# Patient Record
Sex: Female | Born: 1965 | Race: White | Hispanic: No | Marital: Married | Smoking: Current every day smoker
Health system: Southern US, Community
[De-identification: ages and names within clinical notes are randomized; demographics above are authoritative.]

## PROBLEM LIST (undated history)

## (undated) DIAGNOSIS — I10 Essential (primary) hypertension: Secondary | ICD-10-CM

## (undated) DIAGNOSIS — C439 Malignant melanoma of skin, unspecified: Secondary | ICD-10-CM

## (undated) DIAGNOSIS — E785 Hyperlipidemia, unspecified: Secondary | ICD-10-CM

## (undated) HISTORY — PX: MOHS SURGERY: SUR867

---

## 2007-08-07 ENCOUNTER — Encounter: Admission: RE | Admit: 2007-08-07 | Discharge: 2007-08-07 | Payer: Self-pay | Admitting: Internal Medicine

## 2008-10-28 ENCOUNTER — Emergency Department (HOSPITAL_BASED_OUTPATIENT_CLINIC_OR_DEPARTMENT_OTHER): Admission: EM | Admit: 2008-10-28 | Discharge: 2008-10-28 | Payer: Self-pay | Admitting: Emergency Medicine

## 2008-10-28 ENCOUNTER — Ambulatory Visit: Payer: Self-pay | Admitting: Diagnostic Radiology

## 2008-10-30 ENCOUNTER — Ambulatory Visit: Payer: Self-pay | Admitting: Diagnostic Radiology

## 2008-10-30 ENCOUNTER — Emergency Department (HOSPITAL_BASED_OUTPATIENT_CLINIC_OR_DEPARTMENT_OTHER): Admission: EM | Admit: 2008-10-30 | Discharge: 2008-10-30 | Payer: Self-pay | Admitting: Emergency Medicine

## 2010-04-20 LAB — URINE MICROSCOPIC-ADD ON

## 2010-04-20 LAB — URINE CULTURE: Culture: NO GROWTH

## 2010-04-20 LAB — PREGNANCY, URINE: Preg Test, Ur: NEGATIVE

## 2010-04-20 LAB — URINALYSIS, ROUTINE W REFLEX MICROSCOPIC
Bilirubin Urine: NEGATIVE
Bilirubin Urine: NEGATIVE
Glucose, UA: NEGATIVE mg/dL
Glucose, UA: NEGATIVE mg/dL
Ketones, ur: NEGATIVE mg/dL
Ketones, ur: NEGATIVE mg/dL
Leukocytes, UA: NEGATIVE
Nitrite: NEGATIVE
Nitrite: NEGATIVE
Protein, ur: NEGATIVE mg/dL
Protein, ur: NEGATIVE mg/dL
Specific Gravity, Urine: 1.013 (ref 1.005–1.030)
Specific Gravity, Urine: 1.016 (ref 1.005–1.030)
Urobilinogen, UA: 0.2 mg/dL (ref 0.0–1.0)
Urobilinogen, UA: 1 mg/dL (ref 0.0–1.0)
pH: 5.5 (ref 5.0–8.0)
pH: 6.5 (ref 5.0–8.0)

## 2011-07-31 ENCOUNTER — Encounter (HOSPITAL_BASED_OUTPATIENT_CLINIC_OR_DEPARTMENT_OTHER): Payer: Self-pay

## 2011-07-31 ENCOUNTER — Emergency Department (HOSPITAL_BASED_OUTPATIENT_CLINIC_OR_DEPARTMENT_OTHER)
Admission: EM | Admit: 2011-07-31 | Discharge: 2011-07-31 | Disposition: A | Discharge status: Home / Self Care | Payer: Self-pay | Attending: Emergency Medicine | Admitting: Emergency Medicine

## 2011-07-31 DIAGNOSIS — Z882 Allergy status to sulfonamides status: Secondary | ICD-10-CM | POA: Insufficient documentation

## 2011-07-31 DIAGNOSIS — F17200 Nicotine dependence, unspecified, uncomplicated: Secondary | ICD-10-CM | POA: Insufficient documentation

## 2011-07-31 DIAGNOSIS — Z85820 Personal history of malignant melanoma of skin: Secondary | ICD-10-CM | POA: Insufficient documentation

## 2011-07-31 DIAGNOSIS — L02412 Cutaneous abscess of left axilla: Secondary | ICD-10-CM

## 2011-07-31 DIAGNOSIS — IMO0002 Reserved for concepts with insufficient information to code with codable children: Secondary | ICD-10-CM | POA: Insufficient documentation

## 2011-07-31 HISTORY — DX: Malignant melanoma of skin, unspecified: C43.9

## 2011-07-31 MED ORDER — DOXYCYCLINE HYCLATE 100 MG PO CAPS: 100.0000 mg | ORAL_CAPSULE | Freq: Two times a day (BID) | ORAL | Status: AC

## 2011-07-31 MED ORDER — HYDROCODONE-ACETAMINOPHEN 5-500 MG PO TABS: 1.0000 | ORAL_TABLET | Freq: Four times a day (QID) | ORAL | Status: AC | PRN

## 2011-07-31 NOTE — ED Provider Notes (Signed)
History     CSN: [id number]  Arrival date & time 07/31/11  2119   First MD Initiated Contact with Patient 07/31/11 2257      Chief Complaint  Patient presents with  . Abscess    (Consider location/radiation/quality/duration/timing/severity/associated sxs/prior treatment) Patient is a 46 y.o. female presenting with abscess. The history is provided by the patient.  Abscess  This is a new problem. Episode onset: 2 days. The onset was gradual. The problem occurs continuously. The problem has been rapidly worsening. Affected Location: left axilla. The problem is moderate. The abscess is characterized by redness and swelling. It is unknown what she was exposed to. Pertinent negatives include no fever.    Past Medical History  Diagnosis Date  . Melanoma     Past Surgical History  Procedure Date  . Mohs surgery     No family history on file.  History  Substance Use Topics  . Smoking status: Current Everyday Smoker  . Smokeless tobacco: Not on file  . Alcohol Use: No    OB History    Grav Para Term Preterm Abortions TAB SAB Ect Mult Living                  Review of Systems  Constitutional: Negative for fever.  All other systems reviewed and are negative.    Allergies  Sulfa antibiotics  Home Medications   Current Outpatient Rx  Name Route Sig Dispense Refill  . IBUPROFEN 200 MG PO TABS Oral Take 200 mg by mouth every 6 (six) hours as needed. Patient used this medication for swelling.    Marland Kitchen BACITRACIN-NEOMYCIN-POLYMYXIN OINTMENT TUBE Topical Apply 1 application topically as needed. Patient used this medication for soreness.      BP 133/52  Pulse 80  Temp 98.2 F (36.8 C) (Oral)  Resp 18  Ht 5\' 7"  (1.702 m)  Wt 155 lb (70.308 kg)  BMI 24.28 kg/m2  SpO2 99%  Physical Exam  Nursing note and vitals reviewed. Constitutional: She is oriented to person, place, and time. She appears well-developed and well-nourished. No distress.  HENT:  Head: Normocephalic  and atraumatic.  Neck: Normal range of motion. Neck supple.  Musculoskeletal: Normal range of motion.  Neurological: She is alert and oriented to person, place, and time.  Skin: Skin is warm and dry. She is not diaphoretic.       There is 2.5 round, fluctuant lesion in the left axilla.  It is ttp.      ED Course  Procedures (including critical care time)  Labs Reviewed - No data to display No results found.   No diagnosis found.  INCISION AND DRAINAGE Performed by: Geoffery Lyons Consent: Verbal consent obtained. Risks and benefits: risks, benefits and alternatives were discussed Type: abscess  Body area: left axilla  Anesthesia: local infiltration  Local anesthetic: lidocaine 2% without epinephrine  Anesthetic total: 1 ml  Complexity: complex Blunt dissection to break up loculations  Drainage: purulent  Drainage amount: moderate  Packing material: 1/4 in iodoform gauze  Patient tolerance: Patient tolerated the procedure well with no immediate complications.      MDM  Will treat with doxy, lortab, warm soaks.  Packing removal in two days.          Geoffery Lyons, MD 07/31/11 [phone]

## 2011-07-31 NOTE — ED Notes (Signed)
"  pea size knot" to left axilla x 3 months-worse since yesterday

## 2011-07-31 NOTE — ED Notes (Signed)
MD at bedside. 

## 2011-08-02 ENCOUNTER — Encounter (HOSPITAL_BASED_OUTPATIENT_CLINIC_OR_DEPARTMENT_OTHER): Payer: Self-pay | Admitting: *Deleted

## 2011-08-02 ENCOUNTER — Emergency Department (HOSPITAL_BASED_OUTPATIENT_CLINIC_OR_DEPARTMENT_OTHER)
Admission: EM | Admit: 2011-08-02 | Discharge: 2011-08-02 | Disposition: A | Discharge status: Home / Self Care | Payer: Self-pay | Attending: Emergency Medicine | Admitting: Emergency Medicine

## 2011-08-02 DIAGNOSIS — L0291 Cutaneous abscess, unspecified: Secondary | ICD-10-CM

## 2011-08-02 DIAGNOSIS — Z85820 Personal history of malignant melanoma of skin: Secondary | ICD-10-CM | POA: Insufficient documentation

## 2011-08-02 DIAGNOSIS — IMO0002 Reserved for concepts with insufficient information to code with codable children: Secondary | ICD-10-CM | POA: Insufficient documentation

## 2011-08-02 DIAGNOSIS — F17200 Nicotine dependence, unspecified, uncomplicated: Secondary | ICD-10-CM | POA: Insufficient documentation

## 2011-08-02 NOTE — ED Provider Notes (Signed)
History     CSN: [id number]  Arrival date & time 08/02/11  1603   First MD Initiated Contact with Patient 08/02/11 1841      Chief Complaint  Patient presents with  . Abscess    (Consider location/radiation/quality/duration/timing/severity/associated sxs/prior treatment) Patient is a 46 y.o. female presenting with abscess. The history is provided by the patient.  Abscess  This is a new problem. The problem occurs rarely. The abscess is present on the left arm. The problem is moderate. The abscess is characterized by itchiness and redness.  Pt here for recheck of abscess.  Pt thinks packing fell out.  Pt complains of a lump under her arm  Past Medical History  Diagnosis Date  . Melanoma     Past Surgical History  Procedure Date  . Mohs surgery     No family history on file.  History  Substance Use Topics  . Smoking status: Current Everyday Smoker  . Smokeless tobacco: Not on file  . Alcohol Use: No    OB History    Grav Para Term Preterm Abortions TAB SAB Ect Mult Living                  Review of Systems  All other systems reviewed and are negative.    Allergies  Sulfa antibiotics  Home Medications   Current Outpatient Rx  Name Route Sig Dispense Refill  . DOXYCYCLINE HYCLATE 100 MG PO CAPS Oral Take 1 capsule (100 mg total) by mouth 2 (two) times daily. One po bid x 7 days 14 capsule 0  . HYDROCODONE-ACETAMINOPHEN 5-500 MG PO TABS Oral Take 1-2 tablets by mouth every 6 (six) hours as needed for pain. 15 tablet 0  . IBUPROFEN 200 MG PO TABS Oral Take 200 mg by mouth every 6 (six) hours as needed. Patient used this medication for swelling.    Marland Kitchen BACITRACIN-NEOMYCIN-POLYMYXIN OINTMENT TUBE Topical Apply 1 application topically as needed. Patient used this medication for soreness.      BP 112/75  Pulse 69  Temp 98 F (36.7 C) (Oral)  Resp 20  SpO2 100%  Physical Exam  Nursing note and vitals reviewed. Constitutional: She is oriented to person,  place, and time. She appears well-developed and well-nourished.  HENT:  Head: Normocephalic and atraumatic.  Eyes: Conjunctivae and EOM are normal. Pupils are equal, round, and reactive to light.  Neck: Normal range of motion.  Musculoskeletal: Normal range of motion.       Swollen left underam  Neurological: She is alert and oriented to person, place, and time.  Skin: There is erythema.  Psychiatric: She has a normal mood and affect.    ED Course  Procedures (including critical care time)  Labs Reviewed - No data to display No results found. I removed cystic material from wound,  No packing  1. Abscess       MDM          Elson Areas, [place] 08/02/11 1902

## 2011-08-02 NOTE — ED Notes (Signed)
Here for recheck of an abscess under her left arm that was I&D'd 2 days ago.

## 2011-08-02 NOTE — ED Provider Notes (Signed)
History/physical exam/procedure(s) were performed by non-physician practitioner and as supervising physician I was immediately available for consultation/collaboration. I have reviewed all notes and am in agreement with care and plan.   Johann Gascoigne S Lian Pounds, MD 08/02/11 2337 

## 2012-01-27 ENCOUNTER — Encounter (HOSPITAL_BASED_OUTPATIENT_CLINIC_OR_DEPARTMENT_OTHER): Payer: Self-pay | Admitting: *Deleted

## 2012-01-27 ENCOUNTER — Emergency Department (HOSPITAL_BASED_OUTPATIENT_CLINIC_OR_DEPARTMENT_OTHER)
Admission: EM | Admit: 2012-01-27 | Discharge: 2012-01-27 | Disposition: A | Discharge status: Home / Self Care | Payer: Self-pay | Attending: Emergency Medicine | Admitting: Emergency Medicine

## 2012-01-27 DIAGNOSIS — Z85820 Personal history of malignant melanoma of skin: Secondary | ICD-10-CM | POA: Insufficient documentation

## 2012-01-27 DIAGNOSIS — M5431 Sciatica, right side: Secondary | ICD-10-CM

## 2012-01-27 DIAGNOSIS — M545 Low back pain, unspecified: Secondary | ICD-10-CM | POA: Insufficient documentation

## 2012-01-27 DIAGNOSIS — F17200 Nicotine dependence, unspecified, uncomplicated: Secondary | ICD-10-CM | POA: Insufficient documentation

## 2012-01-27 DIAGNOSIS — M5430 Sciatica, unspecified side: Secondary | ICD-10-CM | POA: Insufficient documentation

## 2012-01-27 MED ORDER — HYDROCODONE-ACETAMINOPHEN 5-325 MG PO TABS: 2.0000 | ORAL_TABLET | ORAL | Status: AC | PRN

## 2012-01-27 MED ORDER — PREDNISONE 10 MG PO TABS: ORAL_TABLET | ORAL | Status: DC

## 2012-01-27 NOTE — ED Provider Notes (Signed)
Medical screening examination/treatment/procedure(s) were performed by non-physician practitioner and as supervising physician I was immediately available for consultation/collaboration.   Gavin Pound. Oletta Lamas, MD 01/27/12 2023

## 2012-01-27 NOTE — ED Provider Notes (Signed)
History     CSN: [id number]  Arrival date & time 01/27/12  4098   First MD Initiated Contact with Patient 01/27/12 1952      Chief Complaint  Patient presents with  . Hip Pain    (Consider location/radiation/quality/duration/timing/severity/associated sxs/prior treatment) Patient is a 47 y.o. female presenting with back pain. The history is provided by the patient. No language interpreter was used.  Back Pain  This is a new problem. The current episode started more than 2 days ago. The problem occurs constantly. The problem has been gradually worsening. The pain is associated with no known injury. The pain is present in the lumbar spine. The quality of the pain is described as aching. The pain does not radiate. The pain is at a severity of 7/10. The patient is experiencing no pain. The pain is the same all the time. Stiffness is present all day. She has tried nothing for the symptoms. The treatment provided no relief.  Pt complains of pain going down her right leg.  Pt reports she had the same about a year ago  Past Medical History  Diagnosis Date  . Melanoma     Past Surgical History  Procedure Date  . Mohs surgery     History reviewed. No pertinent family history.  History  Substance Use Topics  . Smoking status: Current Every Day Smoker  . Smokeless tobacco: Not on file  . Alcohol Use: No    OB History    Grav Para Term Preterm Abortions TAB SAB Ect Mult Living                  Review of Systems  Musculoskeletal: Positive for back pain.  All other systems reviewed and are negative.    Allergies  Sulfa antibiotics  Home Medications   Current Outpatient Rx  Name  Route  Sig  Dispense  Refill  . IBUPROFEN 200 MG PO TABS   Oral   Take 200 mg by mouth every 6 (six) hours as needed. Patient used this medication for swelling.         Marland Kitchen BACITRACIN-NEOMYCIN-POLYMYXIN OINTMENT TUBE   Topical   Apply 1 application topically as needed. Patient used this  medication for soreness.           BP 115/65  Pulse 84  Temp 98.3 F (36.8 C) (Oral)  Resp 18  Wt 159 lb (72.122 kg)  SpO2 100%  Physical Exam  Nursing note and vitals reviewed. Constitutional: She appears well-developed and well-nourished.  HENT:  Head: Normocephalic and atraumatic.  Eyes: Pupils are equal, round, and reactive to light.  Neck: Normal range of motion.  Cardiovascular: Normal rate and regular rhythm.   Pulmonary/Chest: Effort normal and breath sounds normal.  Abdominal: Soft.  Musculoskeletal: She exhibits tenderness.       Tender ls spine right side,  nv and ns intact  Neurological: She is alert.  Skin: Skin is warm.    ED Course  Procedures (including critical care time)  Labs Reviewed - No data to display No results found.   No diagnosis found.    MDM     Prednisone taper,  rx for hydrocodone.   Pt advised to follow up with Dr. Pearletha Forge for recheck if pain persist past one week     Lonia Skinner [place], [place] 01/27/12 2020

## 2012-01-27 NOTE — ED Notes (Signed)
Right hip pain radiates down her right leg.

## 2012-01-27 NOTE — ED Notes (Signed)
MD at bedside. Langston Masker, PA to bedside.

## 2012-01-29 ENCOUNTER — Ambulatory Visit: Payer: Self-pay | Admitting: Family Medicine

## 2012-01-30 ENCOUNTER — Ambulatory Visit: Payer: Self-pay | Admitting: Family Medicine

## 2012-01-31 ENCOUNTER — Ambulatory Visit: Payer: Self-pay | Admitting: Family Medicine

## 2012-06-18 ENCOUNTER — Ambulatory Visit: Payer: Self-pay | Attending: Family Medicine | Admitting: Internal Medicine

## 2012-06-18 VITALS — BP 158/79 | HR 55 | Temp 98.3°F | Resp 16 | Wt 165.6 lb

## 2012-06-18 DIAGNOSIS — Z09 Encounter for follow-up examination after completed treatment for conditions other than malignant neoplasm: Secondary | ICD-10-CM | POA: Insufficient documentation

## 2012-06-18 DIAGNOSIS — C439 Malignant melanoma of skin, unspecified: Secondary | ICD-10-CM

## 2012-06-18 DIAGNOSIS — Z85820 Personal history of malignant melanoma of skin: Secondary | ICD-10-CM | POA: Insufficient documentation

## 2012-06-18 NOTE — Progress Notes (Signed)
Patient ID: Veronica Griffith, female   DOB: 1965-03-09, 47 y.o.   MRN: [id number]  CC: dermatology referral   HPI: Pt is 47 yo female with history of melanoma, came in requesting referral to ensure no additional biopsy needed. She has noticed small brown bump in right forearm, 1 cm in diameter, brown, irregular border. She denies fevers, chill, abdominal or urinary concerns.   Allergies  Allergen Reactions  . Sulfa Antibiotics Other (See Comments)    Burning sensation from the inside out.   Past Medical History  Diagnosis Date  . Melanoma    Current Outpatient Prescriptions on File Prior to Visit  Medication Sig Dispense Refill  . ibuprofen (ADVIL,MOTRIN) 200 MG tablet Take 200 mg by mouth every 6 (six) hours as needed. Patient used this medication for swelling.      . neomycin-bacitracin-polymyxin (NEOSPORIN) OINT Apply 1 application topically as needed. Patient used this medication for soreness.      . predniSONE (DELTASONE) 10 MG tablet 6,5,4,3,2,1 taper  21 tablet  0   No current facility-administered medications on file prior to visit.   Family history of cancers.  History   Social History  . Marital Status: Married    Spouse Name: N/A    Number of Children: N/A  . Years of Education: N/A   Occupational History  . Not on file.   Social History Main Topics  . Smoking status: Current Every Day Smoker  . Smokeless tobacco: Not on file  . Alcohol Use: No  . Drug Use: No  . Sexually Active: Not on file   Other Topics Concern  . Not on file   Social History Narrative  . No narrative on file    Review of Systems  Constitutional: Negative for fever, chills, diaphoresis, activity change, appetite change and fatigue.  HENT: Negative for ear pain, nosebleeds, congestion, facial swelling, rhinorrhea, neck pain, neck stiffness and ear discharge.   Eyes: Negative for pain, discharge, redness, itching and visual disturbance.  Respiratory: Negative for cough, choking,  chest tightness, shortness of breath, wheezing and stridor.   Cardiovascular: Negative for chest pain, palpitations and leg swelling.  Gastrointestinal: Negative for abdominal distention.  Genitourinary: Negative for dysuria, urgency, frequency, hematuria, flank pain, decreased urine volume, difficulty urinating and dyspareunia.  Musculoskeletal: Negative for back pain, joint swelling, arthralgias and gait problem.  Neurological: Negative for dizziness, tremors, seizures, syncope, facial asymmetry, speech difficulty, weakness, light-headedness, numbness and headaches.  Hematological: Negative for adenopathy. Does not bruise/bleed easily.  Psychiatric/Behavioral: Negative for hallucinations, behavioral problems, confusion, dysphoric mood, decreased concentration and agitation.    Objective:   Filed Vitals:   06/18/12 1157  BP: 158/79  Pulse: 55  Temp: 98.3 F (36.8 C)  Resp: 16    Physical Exam  Constitutional: Appears well-developed and well-nourished. No distress.  HENT: Normocephalic. External right and left ear normal. Oropharynx is clear and moist.  Eyes: Conjunctivae and EOM are normal. PERRLA, no scleral icterus.  Neck: Normal ROM. Neck supple. No JVD. No tracheal deviation. No thyromegaly.  CVS: RRR, S1/S2 +, no murmurs, no gallops, no carotid bruit.  Pulmonary: Effort and breath sounds normal, no stridor, rhonchi, wheezes, rales.  Abdominal: Soft. BS +,  no distension, tenderness, rebound or guarding.  Musculoskeletal: Normal range of motion. No edema and no tenderness.  Lymphadenopathy: No lymphadenopathy noted, cervical, inguinal. Neuro: Alert. Normal reflexes, muscle tone coordination. No cranial nerve deficit. Skin: Skin is warm and dry. No rash noted. Not diaphoretic. No erythema. No  pallor. Small brown 1 cm spot, irregular border on the right forearm.   Psychiatric: Normal mood and affect. Behavior, judgment, thought content normal.   No results found for this  basename: WBC, HGB, HCT, MCV, PLT   No results found for this basename: CREATININE, BUN, NA, K, CL, CO2    No results found for this basename: HGBA1C   Lipid Panel  No results found for this basename: chol, trig, hdl, cholhdl, vldl, ldlcalc       Assessment and plan:   Skin spot - worrisome for malignancy, will order referral for dermatology - discusses protective clothing and sunscreen use

## 2012-06-18 NOTE — Patient Instructions (Addendum)
Melanoma Melanoma is the least common, but most dangerous, form of skin cancer. This is because it can spread (metastasize) to other organs and can be life-threatening. Melanoma is a cancerous (malignant) tumor that begins in a certain type of cells, called melanocytes. Melanocytes are the cells that produce the color (pigment) called melanin. Melanin colors our skin, hair, eyes, and moles. CAUSES  The exact cause of melanoma is unknown. You may have a higher risk if you:  Spend or have spent a lot of time in the sun. This includes sunlamp and tanning booth exposure.  Have had sunburns. This put you at a particularly increased risk for melanoma. The more blistering sunburns a person has, the higher the risk.  Spend time in parts of the world with more intense sunlight.  Have fair skin that does not tan easily. You may have a lower risk if you have a darker skin color. However, people with darker skin can get melanoma, especially on the hands and feet (acral areas).  Have a close relative (parent, sibling) who has melanoma.  Have a large number of skin moles (more than 100). SYMPTOMS  A skin mole is suspicious if it has any of these 5 traits. This is called the ABCDE's of melanoma:  Asymmetry: Irregular shape, not simply round or oval.  Border: Edge of the mole is irregular, not smooth.  Color: Mole may have multiple colors in it, including brown, black, blue, red, or tan.  Diameter: More than 0.2 inches (6 mm) across.  Evolving: Any unusual change or symptoms in the mole, such as pain, itching, stinging, sensitivity, or bleeding. A mole that is noticeably changing in appearance, or any new mole, should be checked for melanoma. In general, people develop new moles until age 30. New moles after this age should be brought to the attention of your caregiver. DIAGNOSIS  Your caregiver can look at your skin and find lesions or moles that may be suspicious. A patient may also notice a mole  with symptoms or a mole that does not look like most of the other moles on his or her body. This is called the "ugly duckling" sign. A tissue sample (biopsy) examined under a microscope is needed to determine if it is melanoma. The size and extent of the biopsy will depend on the location, size, and appearance of the skin lesion or mole. The biopsy can also reveal whether melanoma has spread to deeper layers of the skin. TREATMENT  Surgery to completely remove the melanoma is required. Lymph nodes may also be removed. If the melanoma has spread to other organs, such as the liver, lungs, bone, or brain, cancer-fighting drugs (chemotherapy) must be used. Your caregiver will discuss your treatment options with you. You can ask about being included in a clinical trial to evaluate new forms of treatment. Melanoma can occasionally recur years after the initial diagnosis. If you have melanoma, you will need follow-up visits with your caregiver for many years. PREVENTION  Risk for melanoma can be reduced by minimizing sun exposure. Practice the 3 S's:  Slip on a shirt.  Slop on sunscreen.  Slap on a hat. Do not spend time in the sun during peak midafternoon hours. Sunscreen/sunblock with SPF 30 or higher and UVA/UVB block should be applied regularly. You should do this even during brief exposure to sunlight. You should also do this on cloudy days and in winter, even though the perceived sunlight is less. Always avoid sunburn! Wear sunglasses that block UV   light. Be sure to see your caregiver if you have any new or changing moles. HOME CARE INSTRUCTIONS   Follow wound care instructions after surgical removal of your melanoma.  Practice good sun avoidance and protective measures as described above.  Let your close family members (parents, children, siblings) know about your diagnosis. This puts them at a higher risk of getting melanoma than the general population. SEEK MEDICAL CARE IF:   You notice any  new moles, or you have any moles that are changing.  You have had a melanoma removed and you notice a new growth near the same location.  You have had a melanoma removed and you experience any new or unexplained health problems. Document Released: 01/01/2005 Document Revised: 03/26/2011 Document Reviewed: 04/22/2009 [employer] Patient Information 2014 [employer].  

## 2012-06-18 NOTE — Progress Notes (Signed)
Patient is here to establish care Has history of melanoma to right side of face Has some concerns about other moles on her body Will need referral to dermatology

## 2016-10-29 ENCOUNTER — Emergency Department (HOSPITAL_COMMUNITY): Payer: BLUE CROSS/BLUE SHIELD

## 2016-10-29 ENCOUNTER — Encounter (HOSPITAL_COMMUNITY): Payer: Self-pay | Admitting: Emergency Medicine

## 2016-10-29 DIAGNOSIS — Z85820 Personal history of malignant melanoma of skin: Secondary | ICD-10-CM | POA: Insufficient documentation

## 2016-10-29 DIAGNOSIS — R51 Headache: Secondary | ICD-10-CM | POA: Insufficient documentation

## 2016-10-29 DIAGNOSIS — J3489 Other specified disorders of nose and nasal sinuses: Secondary | ICD-10-CM | POA: Insufficient documentation

## 2016-10-29 DIAGNOSIS — F17210 Nicotine dependence, cigarettes, uncomplicated: Secondary | ICD-10-CM | POA: Diagnosis not present

## 2016-10-29 LAB — CBC
HEMATOCRIT: 48.1 % — AB (ref 36.0–46.0)
HEMOGLOBIN: 15.9 g/dL — AB (ref 12.0–15.0)
MCH: 30.1 pg (ref 26.0–34.0)
MCHC: 33.1 g/dL (ref 30.0–36.0)
MCV: 91.1 fL (ref 78.0–100.0)
PLATELETS: 237 10*3/uL (ref 150–400)
RBC: 5.28 MIL/uL — AB (ref 3.87–5.11)
RDW: 13.4 % (ref 11.5–15.5)
WBC: 14.4 10*3/uL — AB (ref 4.0–10.5)

## 2016-10-29 LAB — BASIC METABOLIC PANEL
ANION GAP: 10 (ref 5–15)
BUN: 17 mg/dL (ref 6–20)
CO2: 23 mmol/L (ref 22–32)
Calcium: 8.9 mg/dL (ref 8.9–10.3)
Chloride: 104 mmol/L (ref 101–111)
Creatinine, Ser: 0.95 mg/dL (ref 0.44–1.00)
GFR calc non Af Amer: >60 mL/min (ref >60)
Glucose, Bld: 90 mg/dL (ref 65–99)
Potassium: 4.2 mmol/L (ref 3.5–5.1)
SODIUM: 137 mmol/L (ref 135–145)

## 2016-10-29 LAB — I-STAT TROPONIN, ED: Troponin i, poc: 0 ng/mL (ref 0.00–0.08)

## 2016-10-29 NOTE — ED Triage Notes (Signed)
Pt presents to ED for assessment of left sided headache with sharp intermittent pains, denies any pattern, denies dizziness, light-headedness, denies nausea or vomiting.  Pt also mentioned close to the end of her triage that she was having chest pressure for "a little while now" as well.  Epigastric in nature.

## 2016-10-30 ENCOUNTER — Emergency Department (HOSPITAL_COMMUNITY): Payer: BLUE CROSS/BLUE SHIELD

## 2016-10-30 ENCOUNTER — Emergency Department (HOSPITAL_COMMUNITY)
Admission: EM | Admit: 2016-10-30 | Discharge: 2016-10-30 | Disposition: A | Discharge status: Home / Self Care | Payer: BLUE CROSS/BLUE SHIELD | Attending: Emergency Medicine | Admitting: Emergency Medicine

## 2016-10-30 DIAGNOSIS — R51 Headache: Secondary | ICD-10-CM

## 2016-10-30 DIAGNOSIS — R519 Headache, unspecified: Secondary | ICD-10-CM

## 2016-10-30 DIAGNOSIS — R0981 Nasal congestion: Secondary | ICD-10-CM

## 2016-10-30 MED ORDER — FLUTICASONE PROPIONATE 50 MCG/ACT NA SUSP: 2.0000 | Freq: Every day | NASAL | 0 refills | Status: AC

## 2016-10-30 NOTE — Discharge Instructions (Signed)
You were seen today for headache. Your workup is reassuring. Given your congestion symptoms, this may be related to your sinuses. You can take nasal saline or Flonase to try to clear the sinuses. If you develop fevers, neck stiffness, any new or worsening symptoms she should be reevaluated.

## 2016-10-30 NOTE — ED Provider Notes (Signed)
[facility] Provider Note   CSN: [id number] Arrival date & time: 10/29/16  2050     History   Chief Complaint Chief Complaint  Patient presents with  . Chest Pain  . Headache    HPI Veronica Griffith is a 51 y.o. female.  HPI  This is a 51 year old female with a history of melanoma who presents with intermittent left-sided headache. Patient reports 2 day history of sharp left-sided pains in her head. She states they last for seconds and go away. They do not seem to be associated with anything. However today, they persisted longer. Reports recent history of chest congestion and sinus pressure. No fevers. No cough, shortness of breath, abdominal pain, nausea, vomiting. Denies any weakness, numbness, tingling or neurologic symptoms. Currently patient is without symptoms. She denies any neck pain or vision changes.  Past Medical History:  Diagnosis Date  . Melanoma ([facility])     There are no active problems to display for this patient.   Past Surgical History:  Procedure Laterality Date  . MOHS SURGERY      OB History    No data available       Home Medications    Prior to Admission medications   Medication Sig Start Date End Date Taking? Authorizing Provider  ibuprofen (ADVIL,MOTRIN) 200 MG tablet Take 200 mg by mouth every 6 (six) hours as needed for moderate pain.    Yes [provider]  fluticasone (FLONASE) 50 MCG/ACT nasal spray Place 2 sprays into both nostrils daily. 10/30/16   Horton, Barbette Hair, MD    Family History History reviewed. No pertinent family history.  Social History Social History  Substance Use Topics  . Smoking status: Current Every Day Smoker  . Smokeless tobacco: Never Used  . Alcohol use No     Allergies   Sulfa antibiotics   Review of Systems Review of Systems  Constitutional: Negative for fever.  HENT: Positive for congestion and sinus pressure.   Respiratory: Negative for  cough, chest tightness and shortness of breath.   Cardiovascular: Negative for chest pain.  Gastrointestinal: Negative for abdominal pain, nausea and vomiting.  Musculoskeletal: Negative for neck pain.  Skin: Negative for rash.  Neurological: Positive for headaches. Negative for dizziness.  Psychiatric/Behavioral: Negative for confusion.  All other systems reviewed and are negative.    Physical Exam Updated Vital Signs BP 124/74   Pulse (!) 50   Temp 98.7 F (37.1 C) (Oral)   Resp 20   SpO2 100%   Physical Exam  Constitutional: She is oriented to person, place, and time. She appears well-developed and well-nourished.  HENT:  Head: Normocephalic and atraumatic.  No tenderness to palpation of the scalp, no obvious rashes  Eyes: Pupils are equal, round, and reactive to light.  No nystagmus  Neck: Normal range of motion. Neck supple.  No evidence of meningismus  Cardiovascular: Normal rate, regular rhythm and normal heart sounds.   Pulmonary/Chest: Effort normal and breath sounds normal. No respiratory distress. She has no wheezes.  Abdominal: Soft. There is no tenderness.  Neurological: She is alert and oriented to person, place, and time.  Cranial nerves II through XII intact, 5 out of 5 strength in all 4 extremities, no dysmetria to finger-nose-finger  Skin: Skin is warm and dry.  Psychiatric: She has a normal mood and affect.  Nursing note and vitals reviewed.    ED Treatments / Results  Labs (all labs ordered are listed, but only  abnormal results are displayed) Labs Reviewed  CBC - Abnormal; Notable for the following:       Result Value   WBC 14.4 (*)    RBC 5.28 (*)    Hemoglobin 15.9 (*)    HCT 48.1 (*)    All other components within normal limits  BASIC METABOLIC PANEL  I-STAT TROPONIN, ED    EKG  EKG Interpretation  Date/Time:  Monday October 29 2016 21:16:51 EDT Ventricular Rate:  68 PR Interval:  150 QRS Duration: 82 QT Interval:  432 QTC  Calculation: 459 R Axis:   48 Text Interpretation:  Normal sinus rhythm Normal ECG Confirmed by Thayer Jew [phone]) on 10/30/2016 2:35:00 AM       Radiology Dg Chest 2 View  Result Date: 10/29/2016 CLINICAL DATA:  Chest pressure and epigastric pain. EXAM: CHEST  2 VIEW COMPARISON:  None. FINDINGS: The heart size and mediastinal contours are within normal limits. Both lungs are clear. Mild multilevel disc space narrowing along the lower thoracic spine. No acute osseous appearing abnormality. IMPRESSION: No active cardiopulmonary disease. Electronically Signed   By: Ashley Royalty M.D.   On: 10/29/2016 22:03   Ct Head Wo Contrast  Result Date: 10/30/2016 CLINICAL DATA:  Headache EXAM: CT HEAD WITHOUT CONTRAST TECHNIQUE: Contiguous axial images were obtained from the base of the skull through the vertex without intravenous contrast. COMPARISON:  None. FINDINGS: Brain: No evidence of acute infarction, hemorrhage, hydrocephalus, extra-axial collection or mass lesion/mass effect. Mild superficial atrophy for age. Vascular: No hyperdense vessel or unexpected calcification. Skull: Normal. Negative for fracture or focal lesion. Sinuses/Orbits: Polypoid mucosal thickening of the left maxillary sinus. Mild dextroconvex curvature of the nasal septum with right-sided nasal septal spur slightly narrowing the right nasal passage. Other: Clear mastoids bilaterally. IMPRESSION: 1. No acute intracranial abnormality.  Mild superficial atrophy. 2. Mild dextroconvex curvature of the nasal septum with right-sided nasal septal spur. This slightly narrows the right nasal passage. 3. Polypoid mucosal thickening of the left maxillary sinus. Electronically Signed   By: Ashley Royalty M.D.   On: 10/30/2016 03:03    Procedures Procedures (including critical care time)  Medications Ordered in ED Medications - No data to display   Initial Impression / Assessment and Plan / ED Course  I have reviewed the triage vital  signs and the nursing notes.  Pertinent labs & imaging results that were available during my care of the patient were reviewed by me and considered in my medical decision making (see chart for details).     Patient presents with intermittent headache. Fairly atypical in nature. She's currently comfortable. Vital signs reassuring and exam is benign. No evidence of scalp rash to indicate shingles. She is afebrile and no signs of meningitis. Doubt subarachnoid hemorrhage. She does have a history of melanoma. While she has a normal neurologic exam, will obtain a CT scan to rule out atypical presentation of metastasis. CT scan is negative. Other screening lab work, EKG, troponin sent from triage and largely reassuring. This was an evaluation for the patient's chest pressure. However, she endorses recent congestion and upper respiratory symptoms which is likely the etiology.  After history, exam, and medical workup I feel the patient has been appropriately medically screened and is safe for discharge home. Pertinent diagnoses were discussed with the patient. Patient was given return precautions.   Final Clinical Impressions(s) / ED Diagnoses   Final diagnoses:  Nonintractable episodic headache, unspecified headache type  Sinus congestion    New Prescriptions  New Prescriptions   FLUTICASONE (FLONASE) 50 MCG/ACT NASAL SPRAY    Place 2 sprays into both nostrils daily.     Merryl Hacker, MD 10/30/16 [phone]

## 2018-07-01 IMAGING — CT CT HEAD W/O CM
4 series · 17 of 47 positions shown, 19 images · non-contrast
Comparison: None.

CLINICAL DATA: Headache

EXAM:
CT HEAD WITHOUT CONTRAST
TECHNIQUE: Contiguous axial images were obtained from the base of the skull
through the vertex without intravenous contrast.

[Series 3: head without · axial · non-contrast · 0.41mm/px · z∈[-178,-58]mm · 7 of 34 slices shown, 9 images]
[im 5/34  brain]
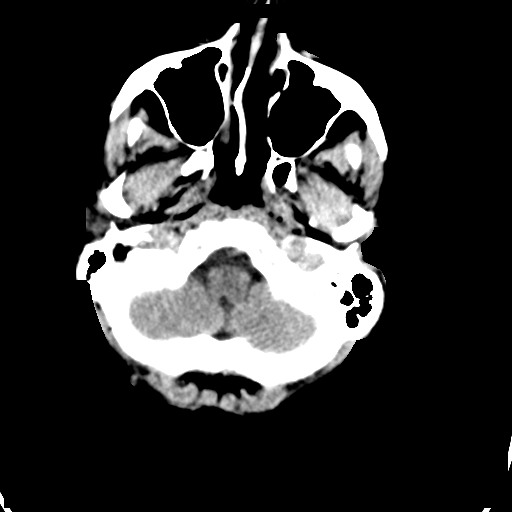
[im 5/34  bone]
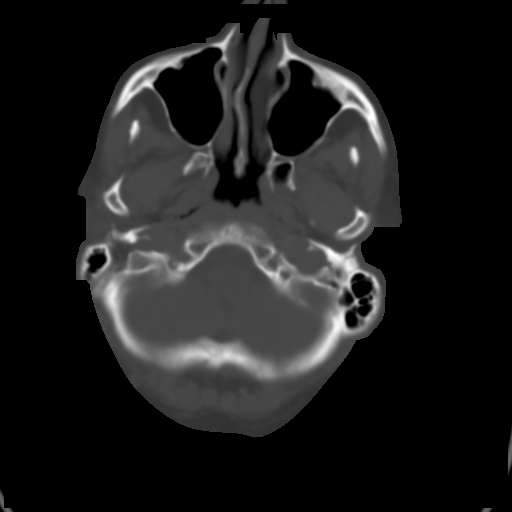
[im 9/34  brain]
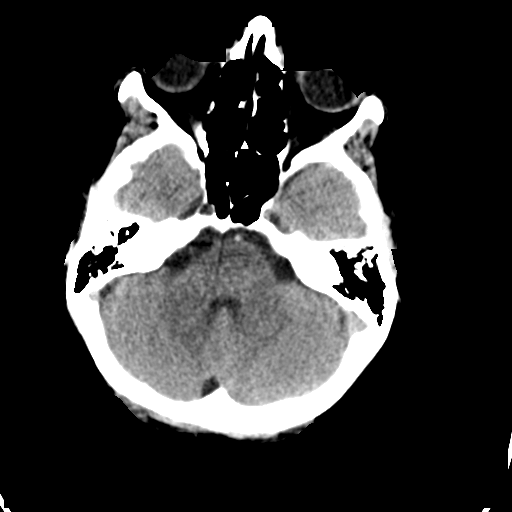
[im 13/34  brain]
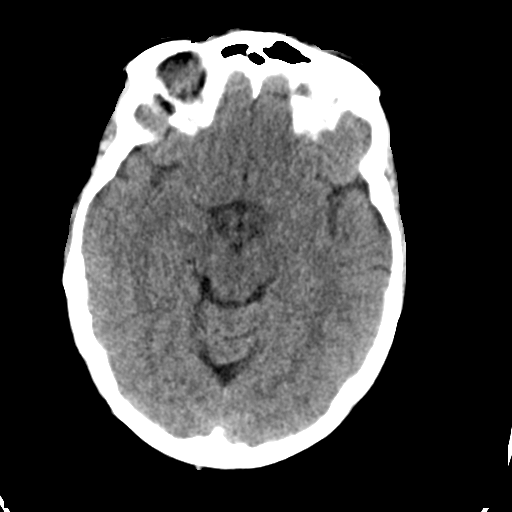
[im 17/34  brain]
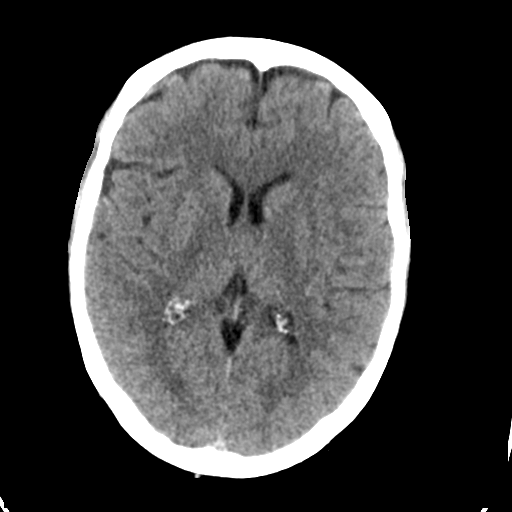
[im 21/34  brain]
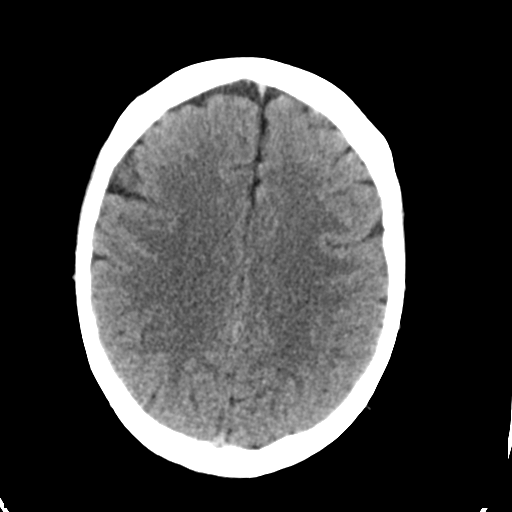
[im 21/34  bone]
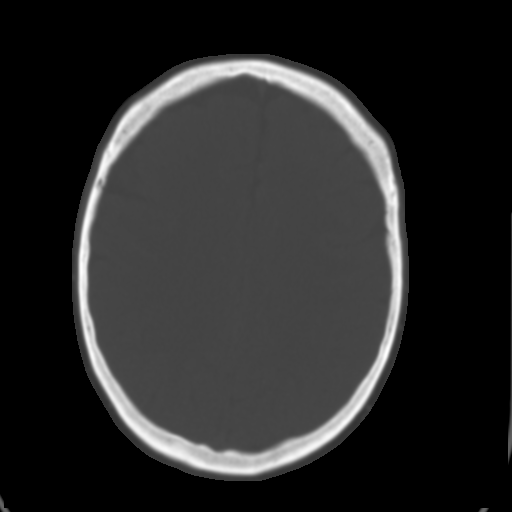
[im 25/34  brain]
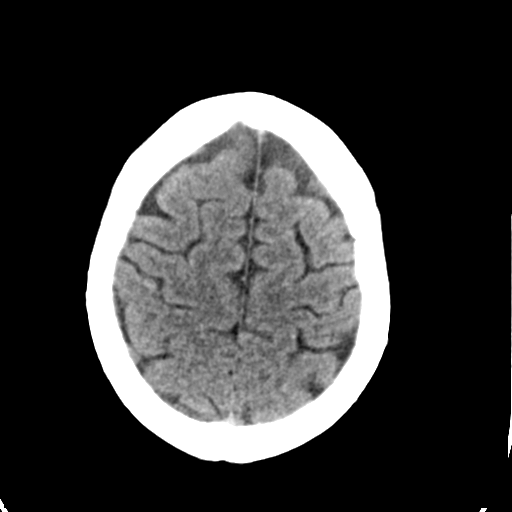
[im 29/34  brain]
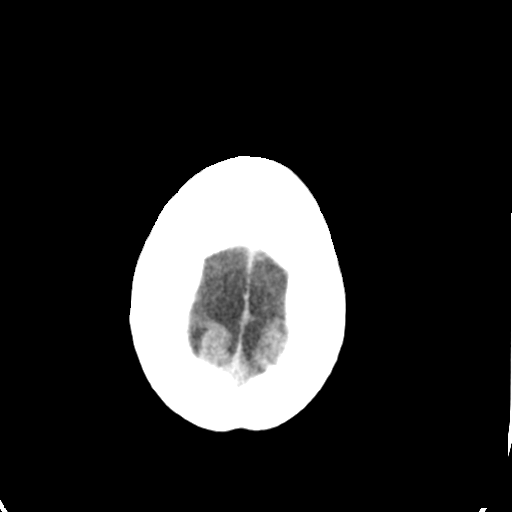

[Series 4: head bone · axial · 0.41mm/px · z∈[-182,-124]mm · 4 of 85 slices shown]
[im 9/85  bone]
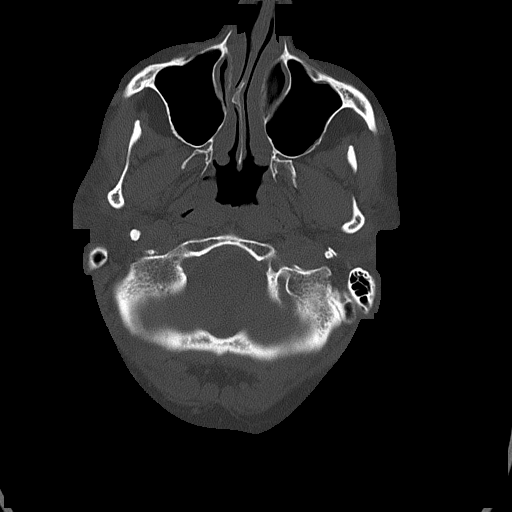
[im 17/85  bone]
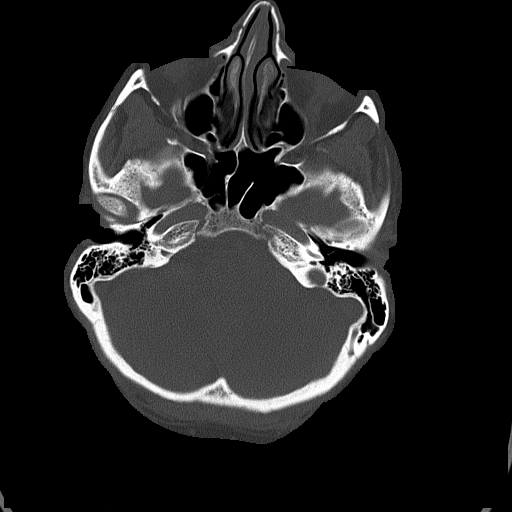
[im 26/85  bone]
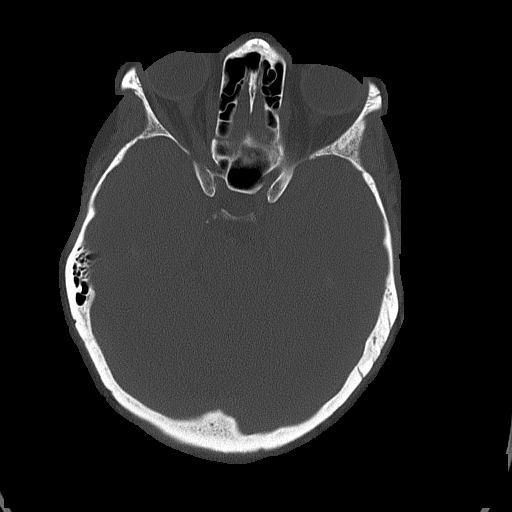
[im 38/85  bone]
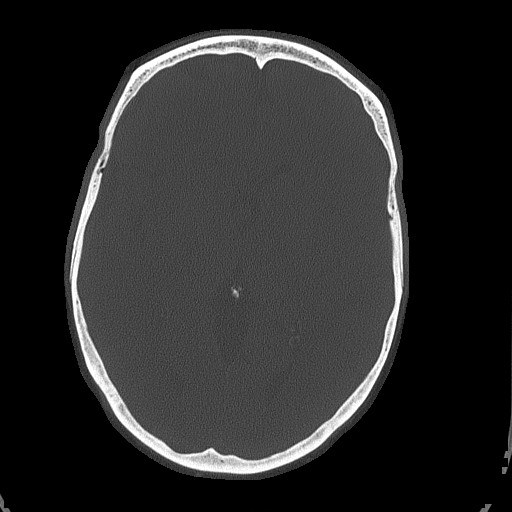

[Series 5: head without cor · coronal · non-contrast · 0.31mm/px · 3 of 70 slices shown]
[im 24/70  brain]
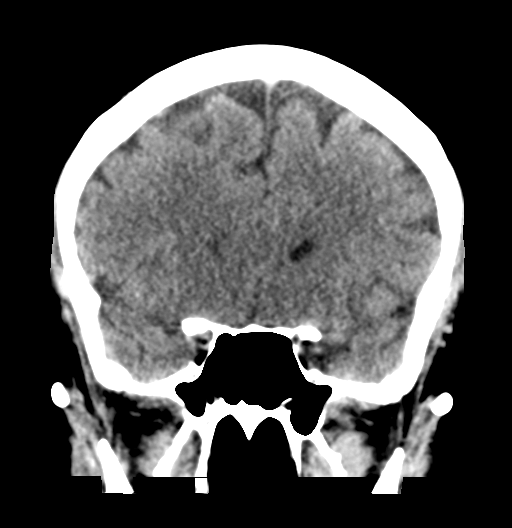
[im 31/70  brain]
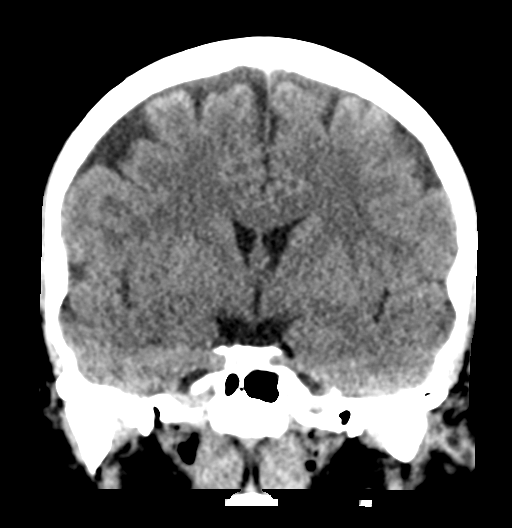
[im 39/70  brain]
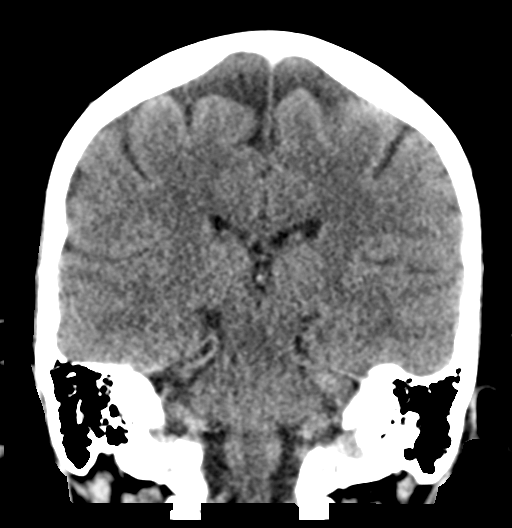

[Series 6: head without sag · sagittal · non-contrast · 0.34mm/px · 3 of 57 slices shown]
[im 19/57  brain]
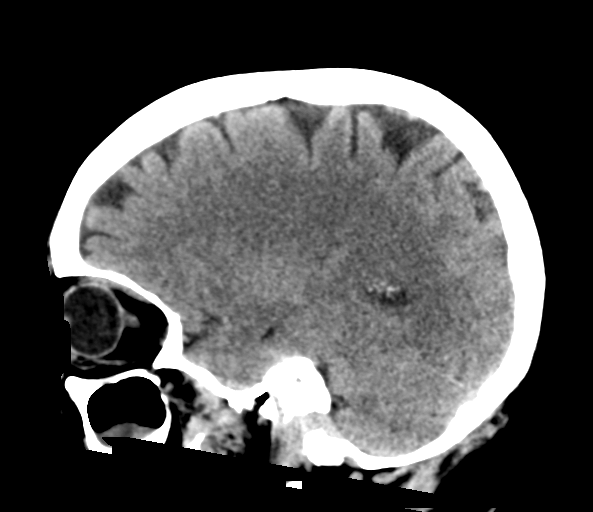
[im 29/57  brain]
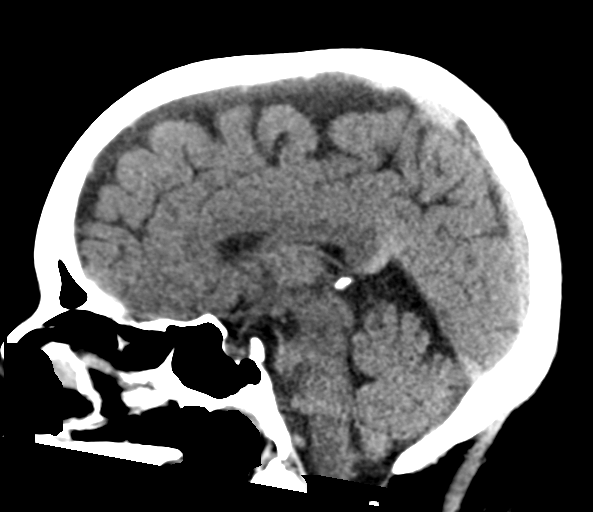
[im 38/57  brain]
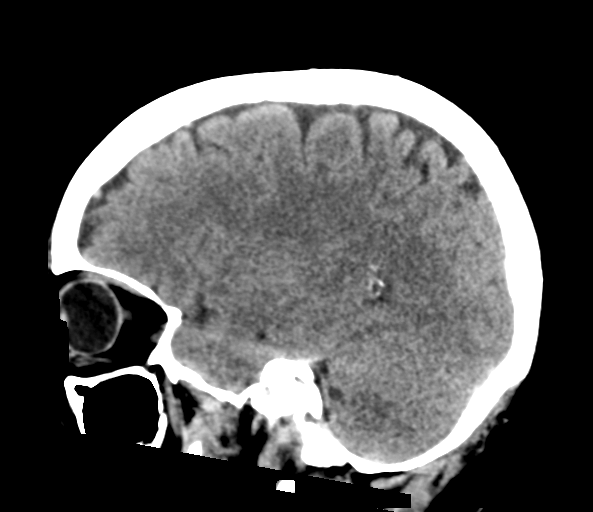

[17 of 47 positions shown; findings below may reference images not displayed]

FINDINGS: Brain: No evidence of acute infarction, hemorrhage, hydrocephalus,
extra-axial collection or mass lesion/mass effect. Mild superficial
atrophy for age.

Vascular: No hyperdense vessel or unexpected calcification.

Skull: Normal. Negative for fracture or focal lesion.

Sinuses/Orbits: Polypoid mucosal thickening of the left maxillary
sinus. Mild dextroconvex curvature of the nasal septum with
right-sided nasal septal spur slightly narrowing the right nasal
passage.

Other: Clear mastoids bilaterally.
IMPRESSION: 1. No acute intracranial abnormality.  Mild superficial atrophy.
2. Mild dextroconvex curvature of the nasal septum with right-sided
nasal septal spur. This slightly narrows the right nasal passage.
3. Polypoid mucosal thickening of the left maxillary sinus.

## 2019-07-30 ENCOUNTER — Emergency Department (HOSPITAL_BASED_OUTPATIENT_CLINIC_OR_DEPARTMENT_OTHER)
Admission: EM | Admit: 2019-07-30 | Discharge: 2019-07-30 | Disposition: A | Discharge status: Home / Self Care | Payer: BLUE CROSS/BLUE SHIELD | Attending: Emergency Medicine | Admitting: Emergency Medicine

## 2019-07-30 ENCOUNTER — Emergency Department (HOSPITAL_BASED_OUTPATIENT_CLINIC_OR_DEPARTMENT_OTHER): Payer: BLUE CROSS/BLUE SHIELD

## 2019-07-30 ENCOUNTER — Other Ambulatory Visit: Payer: Self-pay

## 2019-07-30 ENCOUNTER — Encounter (HOSPITAL_BASED_OUTPATIENT_CLINIC_OR_DEPARTMENT_OTHER): Payer: Self-pay | Admitting: Emergency Medicine

## 2019-07-30 DIAGNOSIS — Z7982 Long term (current) use of aspirin: Secondary | ICD-10-CM | POA: Diagnosis not present

## 2019-07-30 DIAGNOSIS — I10 Essential (primary) hypertension: Secondary | ICD-10-CM | POA: Insufficient documentation

## 2019-07-30 DIAGNOSIS — R109 Unspecified abdominal pain: Secondary | ICD-10-CM | POA: Insufficient documentation

## 2019-07-30 DIAGNOSIS — Z91040 Latex allergy status: Secondary | ICD-10-CM | POA: Insufficient documentation

## 2019-07-30 DIAGNOSIS — F17210 Nicotine dependence, cigarettes, uncomplicated: Secondary | ICD-10-CM | POA: Insufficient documentation

## 2019-07-30 DIAGNOSIS — R0789 Other chest pain: Secondary | ICD-10-CM | POA: Diagnosis not present

## 2019-07-30 DIAGNOSIS — R079 Chest pain, unspecified: Secondary | ICD-10-CM

## 2019-07-30 HISTORY — DX: Hyperlipidemia, unspecified: E78.5

## 2019-07-30 HISTORY — DX: Essential (primary) hypertension: I10

## 2019-07-30 LAB — BASIC METABOLIC PANEL WITH GFR
Anion gap: 10 (ref 5–15)
BUN: 12 mg/dL (ref 6–20)
CO2: 25 mmol/L (ref 22–32)
Calcium: 8.5 mg/dL — ABNORMAL LOW (ref 8.9–10.3)
Chloride: 105 mmol/L (ref 98–111)
Creatinine, Ser: 0.84 mg/dL (ref 0.44–1.00)
GFR calc Af Amer: >60 mL/min
GFR calc non Af Amer: >60 mL/min
Glucose, Bld: 94 mg/dL (ref 70–99)
Potassium: 4 mmol/L (ref 3.5–5.1)
Sodium: 140 mmol/L (ref 135–145)

## 2019-07-30 LAB — CBC
HCT: 48.5 % — ABNORMAL HIGH (ref 36.0–46.0)
Hemoglobin: 16 g/dL — ABNORMAL HIGH (ref 12.0–15.0)
MCH: 30.1 pg (ref 26.0–34.0)
MCHC: 33 g/dL (ref 30.0–36.0)
MCV: 91.2 fL (ref 80.0–100.0)
Platelets: 240 10*3/uL (ref 150–400)
RBC: 5.32 MIL/uL — ABNORMAL HIGH (ref 3.87–5.11)
RDW: 13.2 % (ref 11.5–15.5)
WBC: 10.7 10*3/uL — ABNORMAL HIGH (ref 4.0–10.5)
nRBC: 0 % (ref 0.0–0.2)

## 2019-07-30 LAB — HEPATIC FUNCTION PANEL
ALT: 13 U/L (ref 0–44)
AST: 14 U/L — ABNORMAL LOW (ref 15–41)
Albumin: 4 g/dL (ref 3.5–5.0)
Alkaline Phosphatase: 94 U/L (ref 38–126)
Bilirubin, Direct: 0.1 mg/dL (ref 0.0–0.2)
Indirect Bilirubin: 0.2 mg/dL — ABNORMAL LOW (ref 0.3–0.9)
Total Bilirubin: 0.3 mg/dL (ref 0.3–1.2)
Total Protein: 6.7 g/dL (ref 6.5–8.1)

## 2019-07-30 LAB — TROPONIN I (HIGH SENSITIVITY)
Troponin I (High Sensitivity): 2 ng/L (ref <18)
Troponin I (High Sensitivity): <2 ng/L

## 2019-07-30 LAB — LIPASE, BLOOD: Lipase: 32 U/L (ref 11–51)

## 2019-07-30 MED ORDER — SODIUM CHLORIDE 0.9% FLUSH
3.0000 mL | Freq: Once | INTRAVENOUS | Status: DC
  Filled 2019-07-30: qty 3

## 2019-07-30 MED ORDER — NITROGLYCERIN 0.4 MG SL SUBL
0.4000 mg | SUBLINGUAL_TABLET | SUBLINGUAL | Status: AC | PRN
Start: 2019-07-30 — End: 2019-07-30
  Administered 2019-07-30 (×3): 0.4 mg via SUBLINGUAL
  Filled 2019-07-30: qty 1

## 2019-07-30 MED ORDER — IOHEXOL 350 MG/ML SOLN
100.0000 mL | Freq: Once | INTRAVENOUS | Status: AC | PRN
  Administered 2019-07-30: 09:00:00 100 mL via INTRAVENOUS

## 2019-07-30 MED ORDER — ASPIRIN 81 MG PO CHEW
162.0000 mg | CHEWABLE_TABLET | Freq: Once | ORAL | Status: AC
  Administered 2019-07-30: 09:00:00 162 mg via ORAL
  Filled 2019-07-30: qty 2

## 2019-07-30 NOTE — ED Notes (Signed)
Patient transported to CT 

## 2019-07-30 NOTE — ED Triage Notes (Addendum)
States," I woke around 6am with with chest pain to the center of my chest and it goes to my back" Denies N/V, or SOB. Pain worse with movement . Took 2 baby ASA PTA

## 2019-07-30 NOTE — ED Provider Notes (Signed)
[facility]ERGENCY DEPARTMENT Provider Note  CSN: [id number] Arrival date & time: 07/30/19 0753    History Chief Complaint  Patient presents with  . Chest Pain    HPI  Veronica Griffith is a 54 y.o. female presents to the ED for evaluation of chest pain she describes as sharp, mid-lower sternal and radiating into her back, worse with lying flat. Woke her up from sleep around 6am today. She has otherwise been in her normal state of health recently. She denies any fevers or cough. No SOB or pain radiating into arm or neck. No arm or leg weakness/pain. She took ASA 81mg  x 2 at home before coming to the ED. She has a history of HTN, HLD and a strong family history of CAD. She smokes.    Past Medical History:  Diagnosis Date  . Hyperlipemia   . Hypertension   . Melanoma [facility])     Past Surgical History:  Procedure Laterality Date  . MOHS SURGERY      No family history on file.  Social History   Tobacco Use  . Smoking status: Current Every Day Smoker    Packs/day: 1.00  . Smokeless tobacco: Never Used  Substance Use Topics  . Alcohol use: No  . Drug use: No     Home Medications Prior to Admission medications   Medication Sig Start Date End Date Taking? Authorizing Provider  aspirin EC 81 MG tablet Take 81 mg by mouth daily. Swallow whole.   Yes [provider]  atorvastatin (LIPITOR) 40 MG tablet Take by mouth. 06/26/19  Yes [provider]  fluticasone (FLONASE) 50 MCG/ACT nasal spray Place 2 sprays into both nostrils daily. 10/30/16   Horton, Barbette Hair, MD  ibuprofen (ADVIL,MOTRIN) 200 MG tablet Take 200 mg by mouth every 6 (six) hours as needed for moderate pain.     [provider]  lisinopril (ZESTRIL) 10 MG tablet Take 10 mg by mouth daily. 05/07/19   [provider]     Allergies    Sulfa antibiotics and Latex   Review of Systems   Review of Systems A comprehensive review of systems was completed and  negative except as noted in HPI.    Physical Exam BP 124/70   Pulse (!) 51   Temp 98.4 F (36.9 C)   Resp 12   Ht 5\' 10"  (1.778 m)   Wt 79.2 kg   SpO2 99%   BMI 25.04 kg/m   Physical Exam Vitals and nursing note reviewed.  Constitutional:      Appearance: Normal appearance.  HENT:     Head: Normocephalic and atraumatic.     Nose: Nose normal.     Mouth/Throat:     Mouth: Mucous membranes are moist.  Eyes:     Extraocular Movements: Extraocular movements intact.     Conjunctiva/sclera: Conjunctivae normal.  Cardiovascular:     Rate and Rhythm: Normal rate.  Pulmonary:     Effort: Pulmonary effort is normal.     Breath sounds: Normal breath sounds.  Abdominal:     General: Abdomen is flat.     Palpations: Abdomen is soft.     Tenderness: There is no abdominal tenderness.  Musculoskeletal:        General: No swelling. Normal range of motion.     Cervical back: Neck supple.  Skin:    General: Skin is warm and dry.  Neurological:     General: No focal deficit present.  Mental Status: She is alert.  Psychiatric:        Mood and Affect: Mood normal.      ED Results / Procedures / Treatments   Labs (all labs ordered are listed, but only abnormal results are displayed) Labs Reviewed  BASIC METABOLIC PANEL - Abnormal; Notable for the following components:      Result Value   Calcium 8.5 (*)    All other components within normal limits  CBC - Abnormal; Notable for the following components:   WBC 10.7 (*)    RBC 5.32 (*)    Hemoglobin 16.0 (*)    HCT 48.5 (*)    All other components within normal limits  HEPATIC FUNCTION PANEL - Abnormal; Notable for the following components:   AST 14 (*)    Indirect Bilirubin 0.2 (*)    All other components within normal limits  LIPASE, BLOOD  TROPONIN I (HIGH SENSITIVITY)  TROPONIN I (HIGH SENSITIVITY)    EKG EKG Interpretation  Date/Time:  Thursday July 30 2019 08:09:36 EDT Ventricular Rate:  55 PR Interval:      QRS Duration: 112 QT Interval:  443 QTC Calculation: 424 R Axis:   55 Text Interpretation: Sinus rhythm Borderline intraventricular conduction delay Baseline wander in lead(s) I III aVL No significant change since last tracing Confirmed by Calvert Cantor [phone]) on 07/30/2019 8:22:01 AM    Radiology CT Angio Chest/Abd/Pel for Dissection W and/or Wo Contrast  Result Date: 07/30/2019 CLINICAL DATA:  Chest and back pain. EXAM: CT ANGIOGRAPHY CHEST, ABDOMEN AND PELVIS TECHNIQUE: Non-contrast CT of the chest was initially obtained. Multidetector CT imaging through the chest, abdomen and pelvis was performed using the standard protocol during bolus administration of intravenous contrast. Multiplanar reconstructed images and MIPs were obtained and reviewed to evaluate the vascular anatomy. CONTRAST:  181mL OMNIPAQUE IOHEXOL 350 MG/ML SOLN COMPARISON:  October 28, 2008. FINDINGS: CTA CHEST FINDINGS Cardiovascular: Preferential opacification of the thoracic aorta. No evidence of thoracic aortic aneurysm or dissection. Normal heart size. No pericardial effusion. Mediastinum/Nodes: No enlarged mediastinal, hilar, or axillary lymph nodes. Thyroid gland, trachea, and esophagus demonstrate no significant findings. Lungs/Pleura: Lungs are clear. No pleural effusion or pneumothorax. Musculoskeletal: No chest wall abnormality. No acute or significant osseous findings. Review of the MIP images confirms the above findings. CTA ABDOMEN AND PELVIS FINDINGS VASCULAR Aorta: Normal caliber aorta without aneurysm, dissection, vasculitis or significant stenosis. Celiac: Patent without evidence of aneurysm, dissection, vasculitis or significant stenosis. SMA: Patent without evidence of aneurysm, dissection, vasculitis or significant stenosis. Renals: Both renal arteries are patent without evidence of aneurysm, dissection, vasculitis, fibromuscular dysplasia or significant stenosis. IMA: Patent without evidence of aneurysm,  dissection, vasculitis or significant stenosis. Inflow: Patent without evidence of aneurysm, dissection, vasculitis or significant stenosis. Veins: No obvious venous abnormality within the limitations of this arterial phase study. Review of the MIP images confirms the above findings. NON-VASCULAR Hepatobiliary: No focal liver abnormality is seen. No gallstones, gallbladder wall thickening, or biliary dilatation. Pancreas: Unremarkable. No pancreatic ductal dilatation or surrounding inflammatory changes. Spleen: Normal in size without focal abnormality. Adrenals/Urinary Tract: Adrenal glands are unremarkable. Small nonobstructive right renal calculus is noted. No hydronephrosis or renal obstruction is noted. Bladder is unremarkable. Stomach/Bowel: Stomach is within normal limits. Appendix appears normal. No evidence of bowel wall thickening, distention, or inflammatory changes. Sigmoid diverticulosis is noted without inflammation. Lymphatic: No adenopathy is noted. Reproductive: Uterus and bilateral adnexa are unremarkable. Other: No abdominal wall hernia or abnormality. No abdominopelvic ascites. Musculoskeletal:  No acute or significant osseous findings. Review of the MIP images confirms the above findings. IMPRESSION: No evidence of thoracic or abdominal aortic dissection or aneurysm. Small nonobstructive right renal calculus. No hydronephrosis or renal obstruction is noted. Sigmoid diverticulosis without inflammation. No other abnormality seen in the chest, abdomen or pelvis. Electronically Signed   By: Marijo Conception M.D.   On: 07/30/2019 09:52    Procedures Procedures  Medications Ordered in the ED Medications  sodium chloride flush (NS) 0.9 % injection 3 mL (3 mLs Intravenous Not Given 07/30/19 0815)  nitroGLYCERIN (NITROSTAT) SL tablet 0.4 mg (0.4 mg Sublingual Given 07/30/19 0900)  aspirin chewable tablet 162 mg (162 mg Oral Given 07/30/19 0848)  iohexol (OMNIPAQUE) 350 MG/ML injection 100 mL (100  mLs Intravenous Contrast Given 07/30/19 0915)     MDM Rules/Calculators/A&P MDM Patient's symptoms concerning for aortic dissection. Will check labs, including LFTs and lipase and send for CTA. ASA and NTG ordered. EKG without ischemic changes.  ED Course  I have reviewed the triage vital signs and the nursing notes.  Pertinent labs & imaging results that were available during my care of the patient were reviewed by me and considered in my medical decision making (see chart for details).  Clinical Course as of Jul 29 1117  Thu Jul 30, 2019  5053 CBC unremarkable.    [CS]  9767 BMP normal.    [CS]  0908 LFTs and Lipase are normal. First Trop is negative.    [CS]  3419 CTA neg for dissection. Patient's initial HEART Pathway score is 3. Will awaiting Second Trop to determine disposition.    [CS]  1117 Second Trop is normal. Pain is well controlled now. Patient has a PCP she can see in short term followup. Advised to return to the ED for any worsening pain or other concerns.    [CS]    Clinical Course User Index [CS] Truddie Hidden, MD    Final Clinical Impression(s) / ED Diagnoses Final diagnoses:  Chest pain, unspecified type    Rx / DC Orders ED Discharge Orders    None       Truddie Hidden, MD 07/30/19 1119

## 2023-12-16 ENCOUNTER — Emergency Department (HOSPITAL_BASED_OUTPATIENT_CLINIC_OR_DEPARTMENT_OTHER)

## 2023-12-16 ENCOUNTER — Other Ambulatory Visit: Payer: Self-pay

## 2023-12-16 ENCOUNTER — Encounter (HOSPITAL_BASED_OUTPATIENT_CLINIC_OR_DEPARTMENT_OTHER): Payer: Self-pay

## 2023-12-16 ENCOUNTER — Emergency Department (HOSPITAL_BASED_OUTPATIENT_CLINIC_OR_DEPARTMENT_OTHER): Admission: EM | Admit: 2023-12-16 | Discharge: 2023-12-16 | Disposition: A | Discharge status: Home / Self Care

## 2023-12-16 DIAGNOSIS — J181 Lobar pneumonia, unspecified organism: Secondary | ICD-10-CM | POA: Insufficient documentation

## 2023-12-16 DIAGNOSIS — Z79899 Other long term (current) drug therapy: Secondary | ICD-10-CM | POA: Diagnosis not present

## 2023-12-16 DIAGNOSIS — Z7951 Long term (current) use of inhaled steroids: Secondary | ICD-10-CM | POA: Insufficient documentation

## 2023-12-16 DIAGNOSIS — J449 Chronic obstructive pulmonary disease, unspecified: Secondary | ICD-10-CM | POA: Insufficient documentation

## 2023-12-16 DIAGNOSIS — Z91040 Latex allergy status: Secondary | ICD-10-CM | POA: Insufficient documentation

## 2023-12-16 DIAGNOSIS — Z7982 Long term (current) use of aspirin: Secondary | ICD-10-CM | POA: Insufficient documentation

## 2023-12-16 DIAGNOSIS — I10 Essential (primary) hypertension: Secondary | ICD-10-CM | POA: Insufficient documentation

## 2023-12-16 DIAGNOSIS — J168 Pneumonia due to other specified infectious organisms: Secondary | ICD-10-CM | POA: Insufficient documentation

## 2023-12-16 DIAGNOSIS — D72829 Elevated white blood cell count, unspecified: Secondary | ICD-10-CM | POA: Insufficient documentation

## 2023-12-16 DIAGNOSIS — J189 Pneumonia, unspecified organism: Secondary | ICD-10-CM

## 2023-12-16 DIAGNOSIS — R079 Chest pain, unspecified: Secondary | ICD-10-CM | POA: Diagnosis present

## 2023-12-16 LAB — BASIC METABOLIC PANEL WITH GFR
Anion gap: 10 (ref 5–15)
BUN: 16 mg/dL (ref 6–20)
CO2: 26 mmol/L (ref 22–32)
Calcium: 9.3 mg/dL (ref 8.9–10.3)
Chloride: 103 mmol/L (ref 98–111)
Creatinine, Ser: 0.89 mg/dL (ref 0.44–1.00)
GFR, Estimated: >60 mL/min (ref >60)
Glucose, Bld: 79 mg/dL (ref 70–99)
Potassium: 4.5 mmol/L (ref 3.5–5.1)
Sodium: 138 mmol/L (ref 135–145)

## 2023-12-16 LAB — RESP PANEL BY RT-PCR (RSV, FLU A&B, COVID)  RVPGX2
Influenza A by PCR: NEGATIVE
Influenza B by PCR: NEGATIVE
Resp Syncytial Virus by PCR: NEGATIVE
SARS Coronavirus 2 by RT PCR: NEGATIVE

## 2023-12-16 LAB — CBC
HCT: 48.9 % — ABNORMAL HIGH (ref 36.0–46.0)
Hemoglobin: 16.1 g/dL — ABNORMAL HIGH (ref 12.0–15.0)
MCH: 29.7 pg (ref 26.0–34.0)
MCHC: 32.9 g/dL (ref 30.0–36.0)
MCV: 90.1 fL (ref 80.0–100.0)
Platelets: 305 K/uL (ref 150–400)
RBC: 5.43 MIL/uL — ABNORMAL HIGH (ref 3.87–5.11)
RDW: 13.6 % (ref 11.5–15.5)
WBC: 15.2 K/uL — ABNORMAL HIGH (ref 4.0–10.5)
nRBC: 0 % (ref 0.0–0.2)

## 2023-12-16 LAB — TROPONIN T, HIGH SENSITIVITY
Troponin T High Sensitivity: <15 ng/L (ref 0–19)
Troponin T High Sensitivity: <15 ng/L (ref 0–19)

## 2023-12-16 MED ORDER — SODIUM CHLORIDE 0.9 % IV SOLN
500.0000 mg | Freq: Once | INTRAVENOUS | Status: AC
  Administered 2023-12-16: 500 mg via INTRAVENOUS
  Filled 2023-12-16: qty 5

## 2023-12-16 MED ORDER — KETOROLAC TROMETHAMINE 15 MG/ML IJ SOLN
15.0000 mg | Freq: Once | INTRAMUSCULAR | Status: AC
  Administered 2023-12-16: 15 mg via INTRAVENOUS
  Filled 2023-12-16: qty 1

## 2023-12-16 MED ORDER — IOHEXOL 350 MG/ML SOLN
75.0000 mL | Freq: Once | INTRAVENOUS | Status: AC | PRN
  Administered 2023-12-16: 75 mL via INTRAVENOUS

## 2023-12-16 MED ORDER — AMOXICILLIN-POT CLAVULANATE 875-125 MG PO TABS: 1.0000 | ORAL_TABLET | Freq: Two times a day (BID) | ORAL | 0 refills | Status: AC

## 2023-12-16 MED ORDER — DOXYCYCLINE HYCLATE 100 MG PO CAPS: 100.0000 mg | ORAL_CAPSULE | Freq: Two times a day (BID) | ORAL | 0 refills | Status: AC

## 2023-12-16 MED ORDER — SODIUM CHLORIDE 0.9 % IV SOLN
1.0000 g | Freq: Once | INTRAVENOUS | Status: AC
  Administered 2023-12-16: 1 g via INTRAVENOUS
  Filled 2023-12-16: qty 10

## 2023-12-16 NOTE — Discharge Instructions (Addendum)
 It was a pleasure taking care of you today. You were seen in the Emergency Department for evaluation of chest pain. Your work-up was reassuring. Your CT/Xray/Labs showed evidence of a right upper lobe pneumonia.  Due to your elevated risk of infection because of your COPD, I am going to treat the pneumonia with 2 different antibiotics.  You have received the first dose while in the emergency department.  You will need to pick up the Augmentin and doxycycline  from your pharmacy.  Please take the medication as written on the bottle.  Make sure that you take all of the medicine as prescribed, even if you begin to feel better. Refer to the attached documentation for further management of your symptoms. Follow up with your PCP in 1 week when you have completed the antibiotic course.    There was also an incidental finding on your CT which showed a nodule measuring about 10 mm.  Radiology recommends that you get a repeat chest CT in 3 months for further evaluation.  You can schedule this through your PCP.  Please return to the ER if you experience chest pain, trouble breathing, intractable nausea/vomiting or any other life threatening illnesses.

## 2023-12-16 NOTE — ED Notes (Signed)
 RT ambulated patient with pulse ox. No distress noted. SAT 97%

## 2023-12-16 NOTE — ED Provider Notes (Signed)
 [facility] Provider Note   CSN: [id number] Arrival date & time: 12/16/23  1428     Patient presents with: Chest Pain   Veronica Griffith is a 58 y.o. female with past medical history of COPD, hypertension, hyperlipidemia who presents emergency department for evaluation of left-sided chest pain, shoulder blade pain, cough and congestion.  Patient reports her chest pain started yesterday and only last a few seconds before going away.  Today however she states that she now has pain in between her shoulder blades that feels like a deep pain.  Additionally, she reports a cough and congestion that has been present for several days.  She denies any fever, headache, body aches or chills.  She denies any abdominal pain, nausea, vomiting.  Patient has not taken anything for her symptoms prior to arrival.  Additionally, patient reports a trembling sensation throughout her entire body that she states is weird.    Chest Pain      Prior to Admission medications   Medication Sig Start Date End Date Taking? Authorizing Provider  amLODipine (NORVASC) 5 MG tablet Take 5 mg by mouth daily. 11/11/23  Yes [provider]  amoxicillin-clavulanate (AUGMENTIN) 875-125 MG tablet Take 1 tablet by mouth every 12 (twelve) hours. 12/16/23  Yes Johniya Durfee, Marry RAMAN, PA-C  doxycycline  (VIBRAMYCIN ) 100 MG capsule Take 1 capsule (100 mg total) by mouth 2 (two) times daily. 12/16/23  Yes Tam Delisle, Marry RAMAN, PA-C  losartan (COZAAR) 100 MG tablet Take 100 mg by mouth daily. 11/11/23  Yes [provider]  aspirin  EC 81 MG tablet Take 81 mg by mouth daily. Swallow whole.    [provider]  atorvastatin (LIPITOR) 40 MG tablet Take by mouth. 06/26/19   [provider]  fluticasone  (FLONASE ) 50 MCG/ACT nasal spray Place 2 sprays into both nostrils daily. 10/30/16   Horton, Charmaine FALCON, MD  ibuprofen (ADVIL,MOTRIN) 200 MG tablet Take 200 mg  by mouth every 6 (six) hours as needed for moderate pain.     [provider]  lisinopril (ZESTRIL) 10 MG tablet Take 10 mg by mouth daily. 05/07/19   [provider]  pravastatin (PRAVACHOL) 10 MG tablet Take 10 mg by mouth daily.    [provider]    Allergies: Sulfa antibiotics and Latex    Review of Systems  Cardiovascular:  Positive for chest pain.    Updated Vital Signs BP 138/74 (BP Location: Left Arm)   Pulse 66   Temp 97.8 F (36.6 C) (Oral)   Resp 16   SpO2 100%   Physical Exam Vitals and nursing note reviewed.  Constitutional:      Appearance: Normal appearance.  HENT:     Head: Normocephalic and atraumatic.     Mouth/Throat:     Mouth: Mucous membranes are moist.  Eyes:     General: No scleral icterus.       Right eye: No discharge.        Left eye: No discharge.     Conjunctiva/sclera: Conjunctivae normal.  Cardiovascular:     Rate and Rhythm: Normal rate and regular rhythm.     Pulses: Normal pulses.  Pulmonary:     Effort: Pulmonary effort is normal.     Breath sounds: Decreased breath sounds present.     Comments: Patient with decreased breath sounds throughout bilateral upper and lower lobes. Abdominal:     General: There is no distension.     Tenderness: There is  no abdominal tenderness.  Musculoskeletal:        General: No deformity.     Cervical back: Normal range of motion.  Skin:    General: Skin is warm and dry.     Capillary Refill: Capillary refill takes less than 2 seconds.  Neurological:     Mental Status: She is alert.     Motor: No weakness.  Psychiatric:        Mood and Affect: Mood normal.     (all labs ordered are listed, but only abnormal results are displayed) Labs Reviewed  CBC - Abnormal; Notable for the following components:      Result Value   WBC 15.2 (*)    RBC 5.43 (*)    Hemoglobin 16.1 (*)    HCT 48.9 (*)    All other components within normal limits  RESP PANEL BY RT-PCR (RSV, FLU  A&B, COVID)  [id number]  BASIC METABOLIC PANEL WITH GFR  TROPONIN T, HIGH SENSITIVITY  TROPONIN T, HIGH SENSITIVITY    EKG: None  Radiology: CT Angio Chest PE W and/or Wo Contrast Result Date: 12/16/2023 CLINICAL DATA:  Shortness of breath and left-sided chest pain. EXAM: CT ANGIOGRAPHY CHEST WITH CONTRAST TECHNIQUE: Multidetector CT imaging of the chest was performed using the standard protocol during bolus administration of intravenous contrast. Multiplanar CT image reconstructions and MIPs were obtained to evaluate the vascular anatomy. RADIATION DOSE REDUCTION: This exam was performed according to the departmental dose-optimization program which includes automated exposure control, adjustment of the mA and/or kV according to patient size and/or use of iterative reconstruction technique. CONTRAST:  75mL OMNIPAQUE  IOHEXOL  350 MG/ML SOLN COMPARISON:  CT angiogram chest 07/30/2019. Report only CT of the chest 05/15/2022. FINDINGS: Cardiovascular: Satisfactory opacification of the pulmonary arteries to the segmental level. No evidence of pulmonary embolism. Normal heart size. No pericardial effusion. Mediastinum/Nodes: No enlarged mediastinal, hilar, or axillary lymph nodes. Thyroid gland, trachea, and esophagus demonstrate no significant findings. Lungs/Pleura: Mild emphysema present. There is minimal scarring or atelectasis in the lingula which is new from prior is slightly nodular in configuration measuring up to 10 mm. There is no pleural effusion or pneumothorax. Upper Abdomen: No acute abnormality. Musculoskeletal: No chest wall abnormality. No acute or significant osseous findings. Review of the MIP images confirms the above findings. IMPRESSION: 1. No evidence for pulmonary embolism. 2. Minimal scarring or atelectasis in the lingula. This is slightly nodular in configuration measuring up to 10 mm. Consider one of the following in 3 months for both low-risk and high-risk individuals: (a) repeat chest  CT, (b) follow-up PET-CT, or (c) tissue sampling. This recommendation follows the consensus statement: Guidelines for Management of Incidental Pulmonary Nodules Detected on CT Images: From the Fleischner Society 2017; Radiology 2017; 284:228-243. 3. Emphysema. Emphysema (ICD10-J43.9). Electronically Signed   By: Greig Pique M.D.   On: 12/16/2023 18:35   DG Chest 2 View Result Date: 12/16/2023 CLINICAL DATA:  chest pain EXAM: CHEST - 2 VIEW COMPARISON:  10/29/2016, 07/30/2019 FINDINGS: Vague nodular opacity in the right upper lung zone. No focal airspace consolidation, pleural effusion, or pneumothorax. No cardiomegaly.No acute fracture or destructive lesion. Multilevel thoracic osteophytosis. IMPRESSION: Vague, nodular opacity in the right upper lung zone, only visualized on the frontal radiograph. While this could be artifactual from overlapping bony and soft tissue structures, a developing bronchopneumonia or underlying lung nodule could also have this appearance. Nonemergent chest CT with IV contrast is recommended. Electronically Signed   By: Rogelia Carlean HERO.D.  On: 12/16/2023 15:44     Procedures   Medications Ordered in the ED  cefTRIAXone (ROCEPHIN) 1 g in sodium chloride  0.9 % 100 mL IVPB (1 g Intravenous New Bag/Given 12/16/23 1901)  azithromycin (ZITHROMAX) 500 mg in sodium chloride  0.9 % 250 mL IVPB (has no administration in time range)  iohexol  (OMNIPAQUE ) 350 MG/ML injection 75 mL (75 mLs Intravenous Contrast Given 12/16/23 1714)  ketorolac (TORADOL) 15 MG/ML injection 15 mg (15 mg Intravenous Given 12/16/23 1742)                                   Medical Decision Making Amount and/or Complexity of Data Reviewed Labs: ordered. Radiology: ordered.   This patient presents to the ED for concern of chest pain, this involves an extensive number of treatment options, and is a complaint that carries with it a high risk of complications and morbidity.   Differential diagnosis  includes: COPD exacerbation, flu, COVID, RSV, pulmonary embolism, aortic dissection, pneumonia  Co morbidities:  COPD, hypertension, hyperlipidemia  Lab Tests:  I Ordered, and personally interpreted labs.  The pertinent results include:   - WBC: 15.2  Imaging Studies:  I ordered imaging studies including chest xray, CT PE I independently visualized and interpreted imaging which showed  - xray: Vague, nodular opacity in the right upper lung zone, only visualized  on the frontal radiograph. While this could be artifactual from  overlapping bony and soft tissue structures, a developing  bronchopneumonia or underlying lung nodule could also have this  appearance. Nonemergent chest CT with IV contrast is recommended.    - CT: 1. No evidence for pulmonary embolism.  2. Minimal scarring or atelectasis in the lingula. This is slightly  nodular in configuration measuring up to 10 mm. Consider one of the  following in 3 months for both low-risk and high-risk individuals:  (a) repeat chest CT, (b) follow-up PET-CT, or (c) tissue sampling.  This recommendation follows the consensus statement: Guidelines for  Management of Incidental Pulmonary Nodules Detected on CT Images:  From the Fleischner Society 2017; Radiology 2017; 284:228-243.  3. Emphysema.   I agree with the radiologist interpretation  Cardiac Monitoring/ECG:  The patient was maintained on a cardiac monitor.  I personally viewed and interpreted the cardiac monitored which showed an underlying rhythm of: Sinus rhythm  Medicines ordered and prescription drug management:  I ordered medication including  Medications  cefTRIAXone (ROCEPHIN) 1 g in sodium chloride  0.9 % 100 mL IVPB (1 g Intravenous New Bag/Given 12/16/23 1901)  azithromycin (ZITHROMAX) 500 mg in sodium chloride  0.9 % 250 mL IVPB (has no administration in time range)  iohexol  (OMNIPAQUE ) 350 MG/ML injection 75 mL (75 mLs Intravenous Contrast Given 12/16/23 1714)   ketorolac (TORADOL) 15 MG/ML injection 15 mg (15 mg Intravenous Given 12/16/23 1742)   for pneumonia treatment Reevaluation of the patient after these medicines showed that the patient improved I have reviewed the patients home medicines and have made adjustments as needed  Test Considered:   none  Critical Interventions:   none  Consultations Obtained: None  Problem List / ED Course:     ICD-10-CM   1. Pneumonia of right upper lobe due to infectious organism  J18.9       MDM: 58 year old female who presents emergency department for evaluation of left-sided pleuritic chest pain since yesterday.  She has a longstanding history of COPD and is a current smoker.  She states the episodes of chest pain only last a few seconds and wax and wane.  Beginning today, she is also stating she has pain in between her shoulder blades.  Her chest x-ray shows concern for possible bronchopneumonia or pulmonary nodules, which she has had in the past.  Her lab work shows a leukocytosis of 15.2.  I added on a respiratory panel which was negative for flu, COVID, RSV.  I also ordered a CT angio chest to rule out PE, aortic dissection, neither of which was present.  At this time, I will plan to treat the patient for pneumonia.  Patient's pulse ox while ambulating was 97% with no shortness of breath noted.  I am giving her IV antibiotics in the emergency department and then we will discharge her with oral antibiotics to continue outpatient.  I have educated her on the importance of following up with her PCP when she finishes her medication.  I have also informed the patient that she should continue to use her COPD medicines as prescribed.  Patient verbalized her understanding to this.  Patient's vital signs are stable.  Patient is appropriate for discharge at this time.   Dispostion:  After consideration of the diagnostic results and the patients response to treatment, I feel that the patient would benefit from  antibiotic treatment and PCP follow-up in 1 week.  Final diagnoses:  Pneumonia of right upper lobe due to infectious organism    ED Discharge Orders          Ordered    amoxicillin-clavulanate (AUGMENTIN) 875-125 MG tablet  Every 12 hours        12/16/23 1903    doxycycline  (VIBRAMYCIN ) 100 MG capsule  2 times daily        12/16/23 1903               Torrence Marry GORMAN DEVONNA 12/16/23 1919    Ula Prentice SAUNDERS, MD 12/16/23 2337

## 2023-12-16 NOTE — ED Triage Notes (Signed)
 Intermittent L sided chest pain since yesterday, radiates to upper back.  Also reports cough and congestion  Denies SHOB, N/V
